# Patient Record
Sex: Male | Born: 2000
Health system: Southern US, Community
[De-identification: ages and names within clinical notes are randomized; demographics above are authoritative.]

## PROBLEM LIST (undated history)

## (undated) DIAGNOSIS — K589 Irritable bowel syndrome without diarrhea: Secondary | ICD-10-CM

## (undated) DIAGNOSIS — L309 Dermatitis, unspecified: Secondary | ICD-10-CM

## (undated) HISTORY — PX: NO PAST SURGERIES: SHX2092

---
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
## 2022-05-17 IMAGING — RF DG ERCP WO/W SPHINCTEROTOMY
1 series · 7 of 7 positions shown · non-contrast
Comparison: 10/12/2020

CLINICAL DATA: 19-year-old male with history of
choledocholithiasis.

EXAM:
ERCP
TECHNIQUE: Multiple spot images obtained with the fluoroscopic device and
submitted for interpretation post-procedure.
FLUOROSCOPY TIME:  Fluoroscopy Time:  5 minutes, 27 seconds
Number of Acquired Spot Images: 12

[Series 1: run · 7 of 7 slices shown]
[im 1/7]
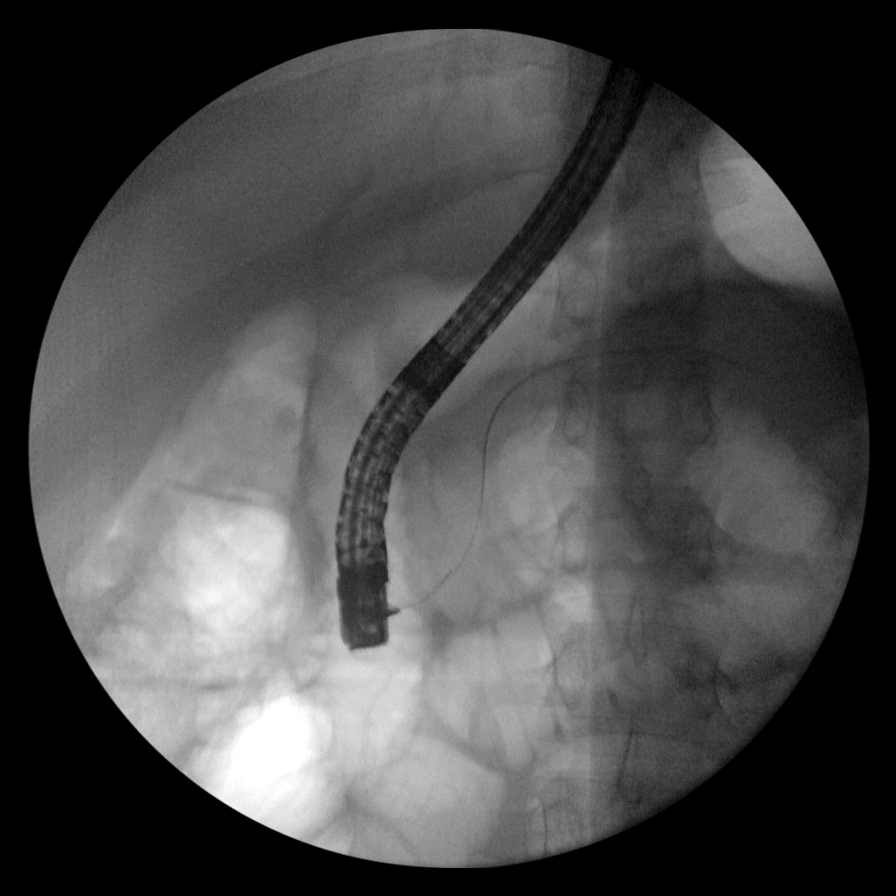
[im 2/7]
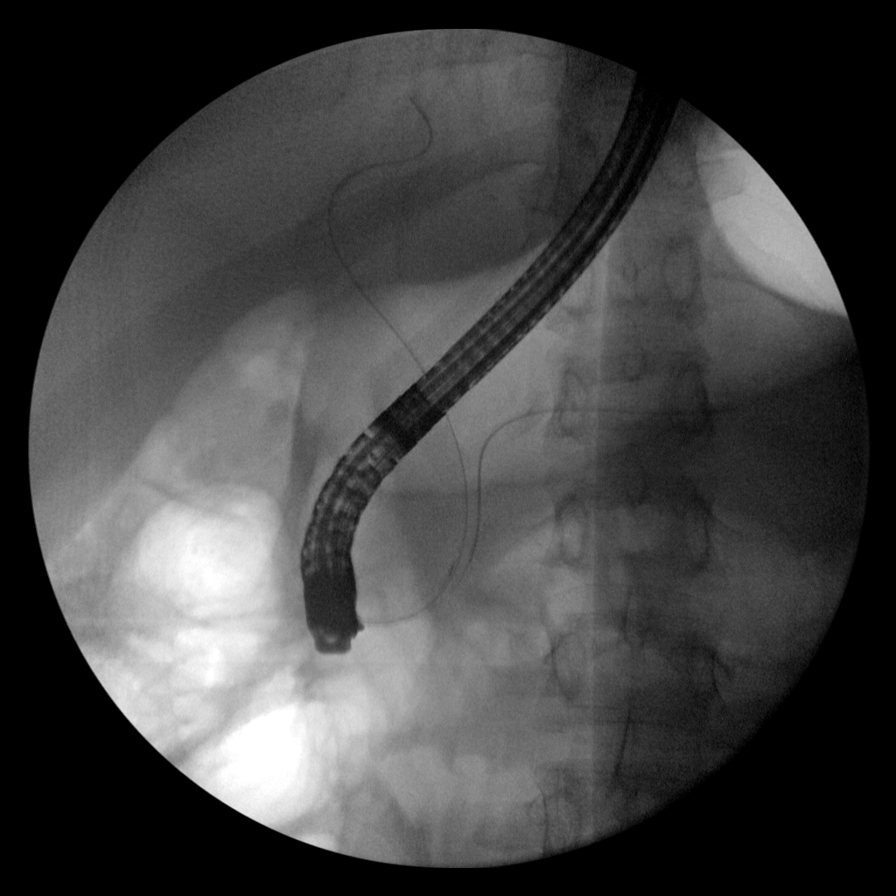
[im 3/7]
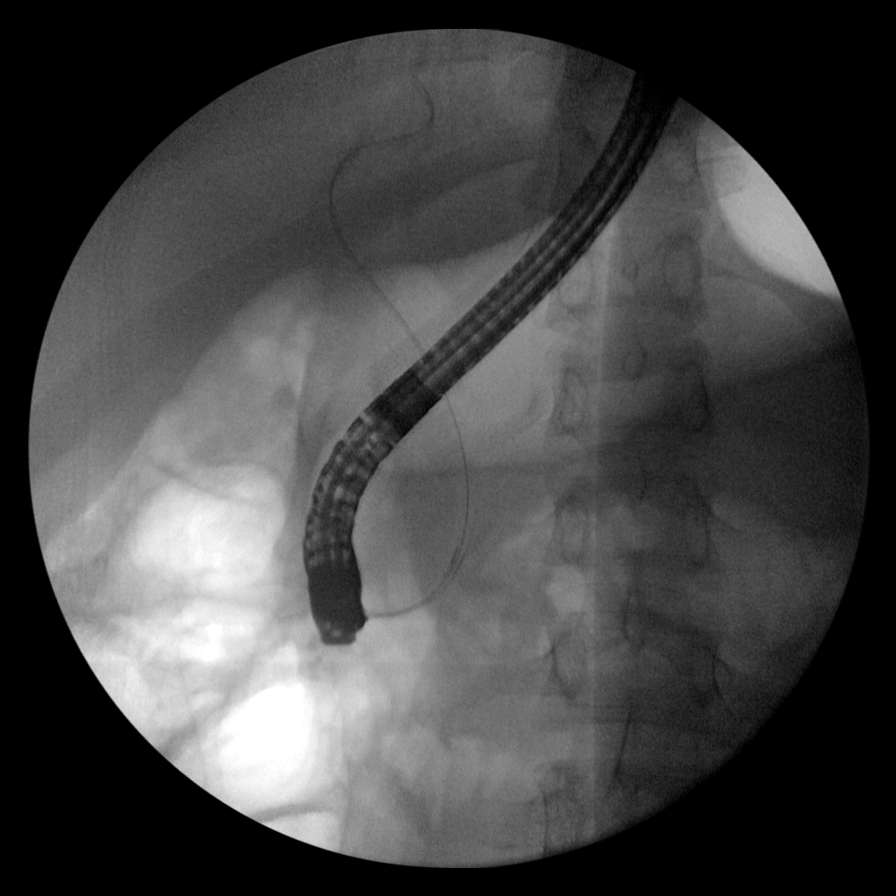
[im 4/7]
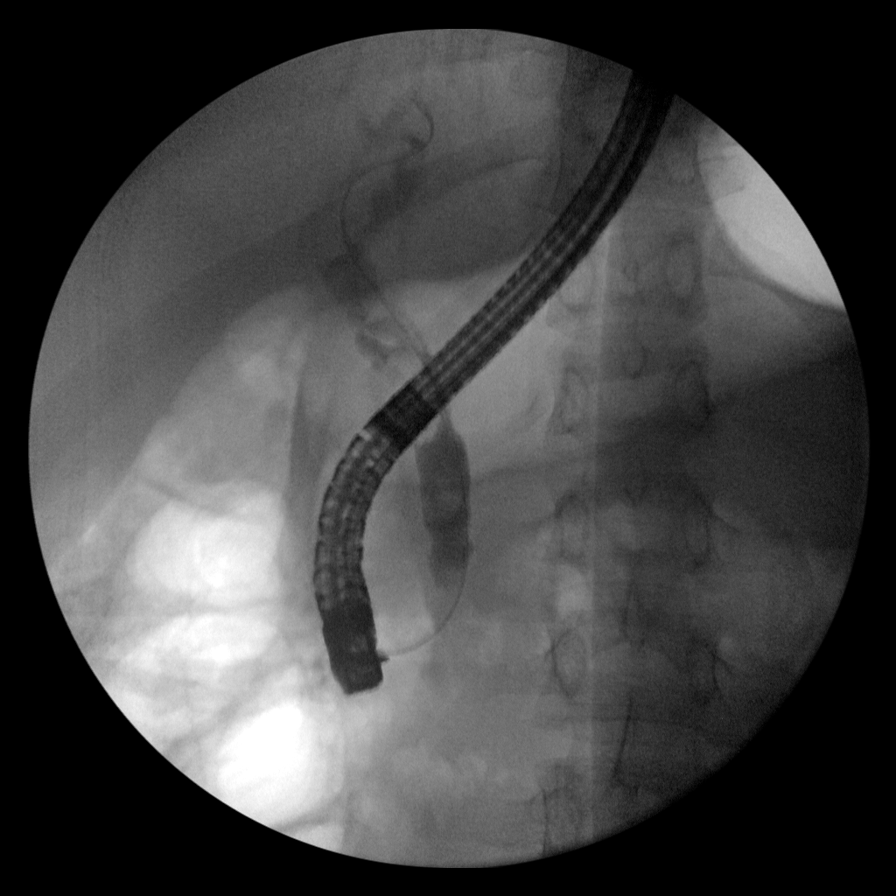
[im 5/7]
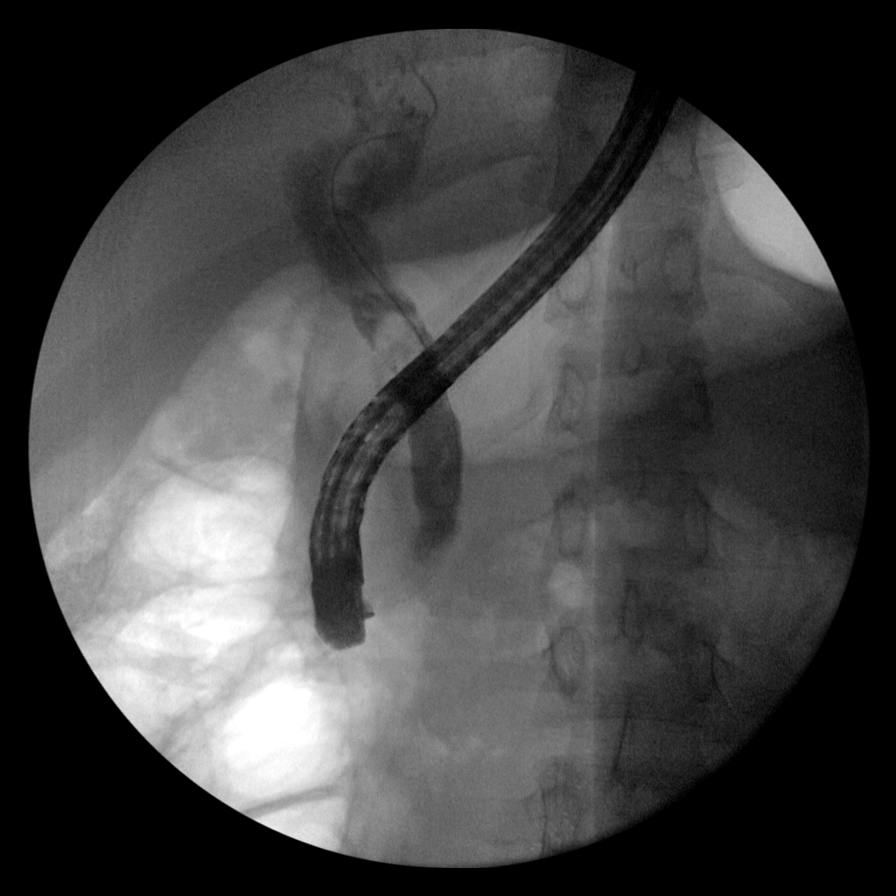
[im 6/7]
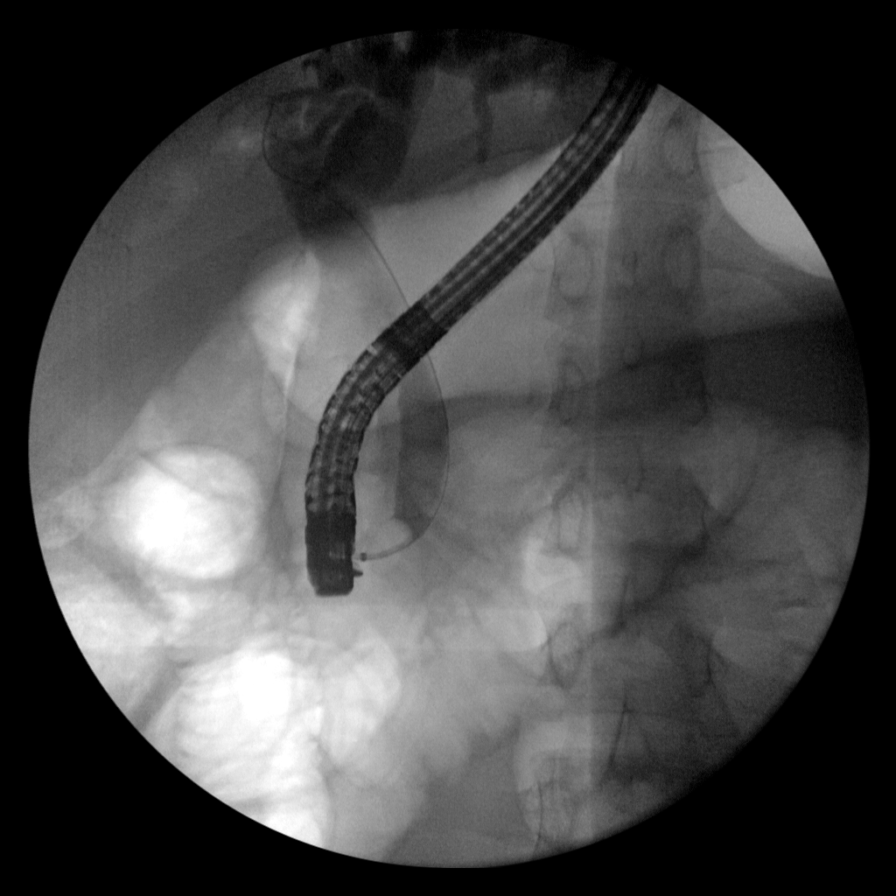
[im 7/7]
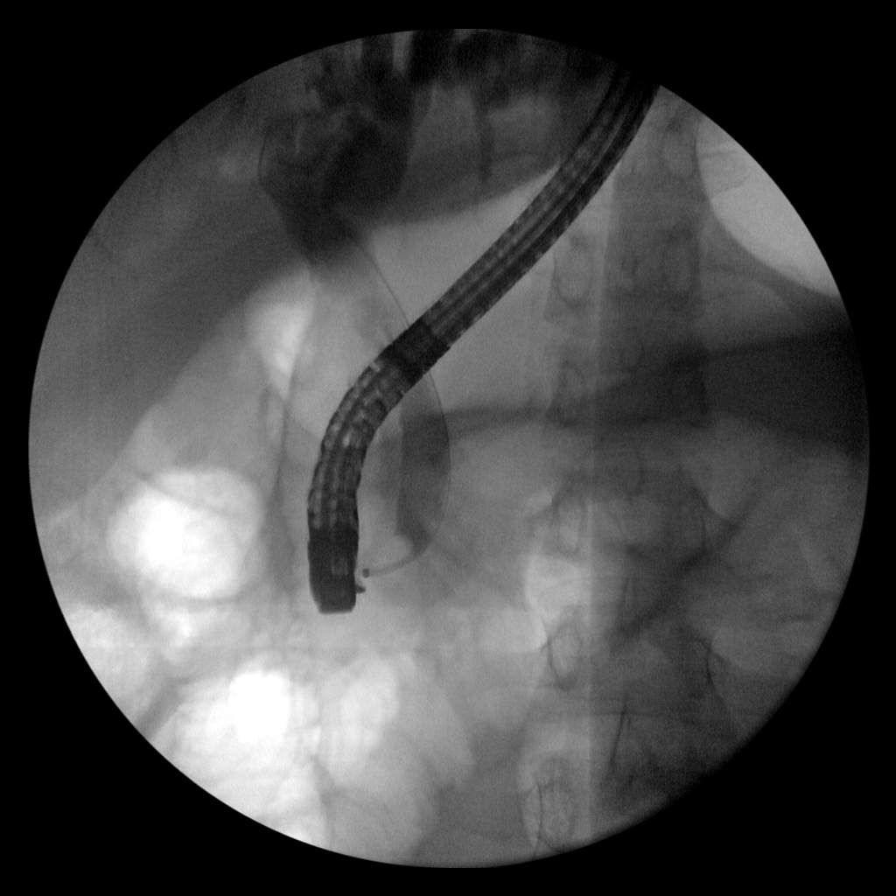

[7 of 7 positions shown; findings below may reference images not displayed]

FINDINGS: Duodenal scope is positioned in the second portion the duodenum.
There is retrograde wire cannulation of the pancreatic duct and
common bile duct. Retrograde cholangiogram demonstrates severe intra
and extrahepatic biliary ductal dilation with filling defect in the
distal common bile duct. Balloon sweep was performed.
IMPRESSION: Choledocholithiasis with associated severe intra and extrahepatic
biliary ductal dilation. Balloon sweep was performed.

These images were submitted for radiologic interpretation only.
Please see the procedural report for the amount of contrast and the
fluoroscopy time utilized.

## 2023-06-19 ENCOUNTER — Other Ambulatory Visit: Payer: Self-pay

## 2023-07-06 ENCOUNTER — Encounter (HOSPITAL_COMMUNITY): Payer: Self-pay | Admitting: Emergency Medicine

## 2023-07-06 ENCOUNTER — Other Ambulatory Visit: Payer: Self-pay

## 2023-07-06 ENCOUNTER — Emergency Department (HOSPITAL_COMMUNITY)
Admission: EM | Admit: 2023-07-06 | Discharge: 2023-07-06 | Disposition: A | Discharge status: Home / Self Care | Payer: Medicaid Other

## 2023-07-06 DIAGNOSIS — K5900 Constipation, unspecified: Secondary | ICD-10-CM | POA: Diagnosis present

## 2023-07-06 NOTE — ED Provider Notes (Signed)
[place] EMERGENCY DEPARTMENT AT [facility] Provider Note   CSN: [id number] Arrival date & time: 07/06/23  1039     History  Chief Complaint  Patient presents with   Constipation    Austin Choi is a 22 y.o. male with medical history of cholecystectomy in 2022, [name] disease, IBS.  The patient reports to the ED for evaluation of constipation.  States that for the last 1-1/2 weeks he has not had a solid bowel movement.  Reports he is able to pass very small amounts of stool but has not had a solid bowel movement in quite some time.  He denies a history of this.  He denies abdominal pain, nausea, vomiting, fevers.  Reports he has tried increasing his water intake, increasing his fiber intake, 12-hour laxative tablets, prune juice without relief.  States that in the past he has had a laparoscopic cholecystectomy but denies any other intra-abdominal surgeries.  He denies any blood in his stool, pain in his rectum.   Constipation Associated symptoms: no abdominal pain, no fever, no nausea and no vomiting        Home Medications Prior to Admission medications   Medication Sig Start Date End Date Taking? Authorizing Provider  acetaminophen (TYLENOL) 325 MG tablet Take 2 tablets (650 mg total) by mouth every 6 (six) hours as needed for mild pain. 10/14/20   Meuth, Brooke A, PA-C  bismuth subsalicylate (PEPTO BISMOL) 262 MG/15ML suspension Take 30 mLs by mouth every 6 (six) hours as needed for indigestion.    [provider]  dicyclomine (BENTYL) 20 MG tablet Take 20 mg by mouth every 6 (six) hours as needed. Abdominal pain 03/09/20   [provider]  ibuprofen (ADVIL,MOTRIN) 400 MG tablet Take 400 mg by mouth every 6 (six) hours as needed for fever, headache or mild pain.    [provider]  polyethylene glycol (MIRALAX / GLYCOLAX) 17 g packet Take 17 g by mouth daily as needed for mild constipation. 10/14/20   Meuth, Lina Sar, PA-C      Allergies     Patient has no known allergies.    Review of Systems   Review of Systems  Constitutional:  Negative for fever.  Gastrointestinal:  Positive for constipation. Negative for abdominal pain, nausea and vomiting.  All other systems reviewed and are negative.   Physical Exam Updated Vital Signs BP (!) 141/86 (BP Location: Left Arm)   Pulse 68   Temp 98.4 F (36.9 C)   Resp 16   SpO2 100%  Physical Exam Vitals and nursing note reviewed.  Constitutional:      General: He is not in acute distress.    Appearance: Normal appearance. He is not ill-appearing, toxic-appearing or diaphoretic.  HENT:     Head: Normocephalic and atraumatic.     Nose: Nose normal.     Mouth/Throat:     Mouth: Mucous membranes are moist.     Pharynx: Oropharynx is clear.  Eyes:     Extraocular Movements: Extraocular movements intact.     Conjunctiva/sclera: Conjunctivae normal.     Pupils: Pupils are equal, round, and reactive to light.  Cardiovascular:     Rate and Rhythm: Normal rate and regular rhythm.  Pulmonary:     Effort: Pulmonary effort is normal.     Breath sounds: Normal breath sounds. No wheezing.  Abdominal:     General: Abdomen is flat. Bowel sounds are normal.     Palpations: Abdomen is soft.  Tenderness: There is no abdominal tenderness.     Comments: No tenderness to patient abdomen  Musculoskeletal:     Cervical back: Normal range of motion and neck supple. No tenderness.  Skin:    General: Skin is warm and dry.     Capillary Refill: Capillary refill takes less than 2 seconds.  Neurological:     Mental Status: He is alert and oriented to person, place, and time.     ED Results / Procedures / Treatments   Labs (all labs ordered are listed, but only abnormal results are displayed) Labs Reviewed - No data to display  EKG None  Radiology No results found.  Procedures Procedures   Medications Ordered in ED Medications - No data to display  ED Course/ Medical  Decision Making/ A&P  Medical Decision Making  22 year old male presents to the ED for evaluation of constipation.  Please see HPI for further details.  On examination the patient is afebrile and nontachycardic.  His lung sounds are clear bilaterally, he is not hypoxic.  His abdomen is soft and compressible throughout with no tenderness.  He is overall nontoxic in appearance with reassuring vital signs.  Patient was offered CT scan of abdomen and pelvis, digital rectal examination and lab work.  The patient is deferring on this at this time and elected for conservative treatment.  Advised the patient to purchase a stool softener such as Colace as well as MiraLAX, Metamucil and increase water intake.  Have encouraged him to take these medications as prescribed.  Patient will need to return to the ED in 2 days if he does not have a bowel movement for further management, laboratory testing and CT scan of his abdomen and pelvis.  He is in agreement with this plan.  He is stable to discharge.   Final Clinical Impression(s) / ED Diagnoses Final diagnoses:  Constipation, unspecified constipation type    Rx / DC Orders ED Discharge Orders     None         Clent Ridges 07/06/23 1318    Durwin Glaze, MD 07/06/23 1520

## 2023-07-06 NOTE — Discharge Instructions (Addendum)
Please purchase MiraLAX, Colace stool softener from your local pharmacy.  Please take his medications as prescribed.  He may mix the MiraLAX with Gatorade.  If you do not have a bowel movement in 2 days, you will need to return to the ED for further management, laboratory testing and most likely a CT scan of your abdomen and pelvis.  If you begin to have severe abdominal pain, nausea or vomiting please return to the ED.  Please read the attached guides concerning constipation and adults, MiraLAX as well as Colace stool softener.  You may also purchase Metamucil which is a fiber supplement.  Please increase your water intake and decrease the amount of sodas that you are consuming.

## 2023-07-06 NOTE — ED Triage Notes (Signed)
Pt reports constipation. Pt states small BM for 3-4 days. Denies abd pain. Pt has tried OTC medication with no relief.
